# Patient Record
Sex: Male | Born: 1975 | Hispanic: Yes | Marital: Single | Smoking: Never smoker
Health system: Southern US, Community
[De-identification: ages and names within clinical notes are randomized; demographics above are authoritative.]

---

## 2015-07-07 ENCOUNTER — Encounter: Payer: Self-pay | Admitting: Emergency Medicine

## 2015-07-07 ENCOUNTER — Emergency Department
Admission: EM | Admit: 2015-07-07 | Discharge: 2015-07-07 | Disposition: A | Discharge status: Home / Self Care | Payer: No Typology Code available for payment source | Attending: Emergency Medicine | Admitting: Emergency Medicine

## 2015-07-07 DIAGNOSIS — Y92410 Unspecified street and highway as the place of occurrence of the external cause: Secondary | ICD-10-CM | POA: Insufficient documentation

## 2015-07-07 DIAGNOSIS — S20212A Contusion of left front wall of thorax, initial encounter: Secondary | ICD-10-CM | POA: Insufficient documentation

## 2015-07-07 DIAGNOSIS — Y998 Other external cause status: Secondary | ICD-10-CM | POA: Diagnosis not present

## 2015-07-07 DIAGNOSIS — S3991XA Unspecified injury of abdomen, initial encounter: Secondary | ICD-10-CM | POA: Diagnosis not present

## 2015-07-07 DIAGNOSIS — Y9389 Activity, other specified: Secondary | ICD-10-CM | POA: Insufficient documentation

## 2015-07-07 DIAGNOSIS — S29001A Unspecified injury of muscle and tendon of front wall of thorax, initial encounter: Secondary | ICD-10-CM | POA: Diagnosis present
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
## 2015-07-12 ENCOUNTER — Ambulatory Visit
Admission: RE | Admit: 2015-07-12 | Discharge: 2015-07-12 | Disposition: A | Discharge status: Home / Self Care | Payer: No Typology Code available for payment source | Source: Ambulatory Visit | Attending: Chiropractor | Admitting: Chiropractor

## 2015-07-12 ENCOUNTER — Other Ambulatory Visit: Payer: Self-pay | Admitting: Chiropractor

## 2015-07-12 DIAGNOSIS — M545 Low back pain: Secondary | ICD-10-CM | POA: Insufficient documentation

## 2015-07-12 DIAGNOSIS — S3992XA Unspecified injury of lower back, initial encounter: Secondary | ICD-10-CM | POA: Insufficient documentation

## 2015-07-12 DIAGNOSIS — R918 Other nonspecific abnormal finding of lung field: Secondary | ICD-10-CM | POA: Diagnosis not present

## 2015-07-12 DIAGNOSIS — S299XXA Unspecified injury of thorax, initial encounter: Secondary | ICD-10-CM | POA: Diagnosis not present

## 2015-07-12 DIAGNOSIS — M546 Pain in thoracic spine: Secondary | ICD-10-CM | POA: Insufficient documentation

## 2015-07-12 DIAGNOSIS — R0781 Pleurodynia: Secondary | ICD-10-CM
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
